# Patient Record
Sex: Male | Born: 1990 | Race: White | Hispanic: No | Marital: Married | Smoking: Never smoker
Health system: Southern US, Community
[De-identification: ages and names within clinical notes are randomized; demographics above are authoritative.]

## PROBLEM LIST (undated history)

## (undated) HISTORY — PX: HERNIA REPAIR: SHX51

---
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
## 2013-07-11 ENCOUNTER — Ambulatory Visit (INDEPENDENT_AMBULATORY_CARE_PROVIDER_SITE_OTHER): Payer: 59 | Admitting: Family Medicine

## 2013-07-11 ENCOUNTER — Encounter: Payer: Self-pay | Admitting: Family Medicine

## 2013-07-11 VITALS — BP 126/83 | HR 79 | Ht 67.0 in | Wt 135.0 lb

## 2013-07-11 DIAGNOSIS — R209 Unspecified disturbances of skin sensation: Secondary | ICD-10-CM

## 2013-07-11 DIAGNOSIS — R200 Anesthesia of skin: Secondary | ICD-10-CM

## 2013-07-11 NOTE — Patient Instructions (Signed)
You suffered a nerve contusion at your wrist. This should resolve over the next 4 weeks though could take up to a year at the worst. Don't put ice over this area. Tylenol, ibuprofen as needed if you have pain. If you get worsening weakness, call me (this is unlikely). Otherwise you can continue to play sports without restrictions.  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  It can also be autonomic dysfunction where you sweat inappropriately from the nerves not responding appropriately that feed sweat glands. You should discuss this more with a family physician to rule out underlying conditions that can cause this. There are some prescription strength medicines, antiperspirants that can be used if those are ruled out.

## 2013-07-12 ENCOUNTER — Encounter: Payer: Self-pay | Admitting: Family Medicine

## 2013-07-12 DIAGNOSIS — R200 Anesthesia of skin: Secondary | ICD-10-CM | POA: Insufficient documentation

## 2013-07-12 NOTE — Progress Notes (Signed)
Patient ID: Dustin Moreno, male   DOB: 1991-07-09, 22 y.o.   MRN: [id number]  PCP: No primary provider on file.  Subjective:   HPI: Patient is a 22 y.o. male here for left thumb numbness.  Patient reports when playing in a club lacrosse game on 10/9 he was hit on radial side of wrist at least once, probably twice. Had pain initially, swelling, mild bruising. Also developed thumb numbness throughout that has persisted while pain has resolved. No weakness. Is left handed.  History reviewed. No pertinent past medical history.  No current outpatient prescriptions on file prior to visit.   No current facility-administered medications on file prior to visit.    Past Surgical History  Procedure Laterality Date  . Hernia repair      No Known Allergies  History   Social History  . Marital Status: Married    Spouse Name: N/A    Number of Children: N/A  . Years of Education: N/A   Occupational History  . Not on file.   Social History Main Topics  . Smoking status: Never Smoker   . Smokeless tobacco: Not on file  . Alcohol Use: Not on file  . Drug Use: Not on file  . Sexual Activity: Not on file   Other Topics Concern  . Not on file   Social History Narrative  . No narrative on file    Family History  Problem Relation Age of Onset  . Sudden death Neg Hx   . Hypertension Neg Hx   . Hyperlipidemia Neg Hx   . Heart attack Neg Hx   . Diabetes Neg Hx     BP 126/83  Pulse 79  Ht 5\' 7"  (1.702 m)  Wt 135 lb (61.236 kg)  BMI 21.14 kg/m2  Review of Systems: See HPI above.    Objective:  Physical Exam:  Gen: NAD  Left hand: Mild bruising radial aspect of wrist.  No swelling, other deformity.  No palpable abnormality, aneurysm of radial artery. No focal TTP. FROM thumb with 5/5 strength all motions of this.  5/5 finger abduction, extension. NVI distally.  MSK u/s:  No evidence of radial artery aneurysm.    Assessment & Plan:  1. Left thumb numbness - 2/2  nerve contusion.  Strength is intact, no tenderness to suggest fracture or warrant radiographs.  Reassured.  Tylenol or ibuprofen if he needed this.  Do not ice the area as can make the numbness persist, worsen.  Cleared for all sports.  Advised this can possibly take months to resolve but likely will improve over next month.

## 2013-07-12 NOTE — Assessment & Plan Note (Signed)
2/2 nerve contusion.  Strength is intact, no tenderness to suggest fracture or warrant radiographs.  Reassured.  Tylenol or ibuprofen if he needed this.  Do not ice the area as can make the numbness persist, worsen.  Cleared for all sports.  Advised this can possibly take months to resolve but likely will improve over next month.

## 2015-07-03 ENCOUNTER — Encounter: Payer: Self-pay | Admitting: Family Medicine

## 2015-07-03 ENCOUNTER — Ambulatory Visit (INDEPENDENT_AMBULATORY_CARE_PROVIDER_SITE_OTHER): Payer: 59 | Admitting: Family Medicine

## 2015-07-03 VITALS — BP 110/75 | HR 65 | Ht 67.0 in | Wt 138.0 lb

## 2015-07-03 DIAGNOSIS — M25572 Pain in left ankle and joints of left foot: Secondary | ICD-10-CM

## 2015-07-03 NOTE — Patient Instructions (Signed)
You have an overuse strain of your posterior tibialis tendon. Arch support is key for this - try the new [employer] shoes you have for at least a few days - if you're not noticing improving buy dr scholls active series inserts or something similar. Icing 15 minutes at a time as needed. Ibuprofen or aleve as needed for pain and inflammation. Theraband strengthening 3 sets of 10 once a day (can do calf raise exercise as well 3 sets of 10) for next 4-6 weeks. Running when tolerated - should not be limping and pain should be a 1-2/10 at most when you're running. Can consider nitro patches, custom orthotics, physical therapy if not improving. Follow up with me in 6 weeks or as needed.

## 2015-07-07 DIAGNOSIS — M25572 Pain in left ankle and joints of left foot: Secondary | ICD-10-CM | POA: Insufficient documentation

## 2015-07-07 NOTE — Progress Notes (Signed)
PCP: No primary care provider on file.  Subjective:   HPI: Patient is a 24 y.o. male here for left ankle tightness.  Patient reports over past couple weeks he's had increasing tightness medial left ankle. No known injury or trauma. Just woke up one morning with this. Pain level 5/10, dull. Tried icing, stretching. Runs 30 miles a week but hasn't in about 2 weeks. No skin changes, swelling, fever, other complaints.  No past medical history on file.  Current Outpatient Prescriptions on File Prior to Visit  Medication Sig Dispense Refill  . loteprednol (LOTEMAX) 0.2 % SUSP 1 drop 4 (four) times daily.     No current facility-administered medications on file prior to visit.    Past Surgical History  Procedure Laterality Date  . Hernia repair      No Known Allergies  Social History   Social History  . Marital Status: Married    Spouse Name: N/A  . Number of Children: N/A  . Years of Education: N/A   Occupational History  . Not on file.   Social History Main Topics  . Smoking status: Never Smoker   . Smokeless tobacco: Not on file  . Alcohol Use: Not on file  . Drug Use: Not on file  . Sexual Activity: Not on file   Other Topics Concern  . Not on file   Social History Narrative    Family History  Problem Relation Age of Onset  . Sudden death Neg Hx   . Hypertension Neg Hx   . Hyperlipidemia Neg Hx   . Heart attack Neg Hx   . Diabetes Neg Hx     BP 110/75 mmHg  Pulse 65  Ht  (1.702 m)  Wt 138 lb (62.596 kg)  BMI 21.61 kg/m2  Review of Systems: See HPI above.    Objective:  Physical Exam:  Gen: NAD  Left ankle: No gross deformity, swelling, ecchymoses Mild overpronation. FROM with pain on IR. TTP over post tib tendon.  No bony or other tenderness. Negative ant drawer and talar tilt.   Negative syndesmotic compression. Thompsons test negative. NV intact distally.  Right ankle: FROM without pain.    Assessment & Plan:  1. Left  ankle pain - 2/2 overuse strain of post tib tendon.  Stressed importance of arch support, avoid barefoot walking/flat shoes as much as possible.  Icing, nsaids as needed.  Shown home exercises to do daily as well.  Activities as tolerated otherwise.  F/u in 6 weeks.  Consider nitro patches, orthotics, PT if not improving.

## 2015-07-07 NOTE — Assessment & Plan Note (Signed)
2/2 overuse strain of post tib tendon.  Stressed importance of arch support, avoid barefoot walking/flat shoes as much as possible.  Icing, nsaids as needed.  Shown home exercises to do daily as well.  Activities as tolerated otherwise.  F/u in 6 weeks.  Consider nitro patches, orthotics, PT if not improving.

## 2015-11-04 MED FILL — DOXYCYCLINE HYCLATE 100 MG: 100 | 30 days supply | Qty: 30 | Fill #0

## 2016-04-25 DIAGNOSIS — H0012 Chalazion right lower eyelid: Secondary | ICD-10-CM | POA: Diagnosis not present

## 2016-04-25 DIAGNOSIS — H11431 Conjunctival hyperemia, right eye: Secondary | ICD-10-CM | POA: Diagnosis not present

## 2016-04-25 DIAGNOSIS — H018 Other specified inflammations of eyelid: Secondary | ICD-10-CM | POA: Diagnosis not present

## 2016-04-25 MED FILL — DOXYCYCLINE HYCLATE 100 MG: 100 | 30 days supply | Qty: 60 | Fill #0

## 2016-05-04 DIAGNOSIS — H01022 Squamous blepharitis right lower eyelid: Secondary | ICD-10-CM | POA: Diagnosis not present

## 2016-05-04 DIAGNOSIS — H0012 Chalazion right lower eyelid: Secondary | ICD-10-CM | POA: Diagnosis not present

## 2016-05-04 DIAGNOSIS — H01025 Squamous blepharitis left lower eyelid: Secondary | ICD-10-CM | POA: Diagnosis not present

## 2016-05-04 DIAGNOSIS — H01024 Squamous blepharitis left upper eyelid: Secondary | ICD-10-CM | POA: Diagnosis not present

## 2016-05-04 DIAGNOSIS — H01021 Squamous blepharitis right upper eyelid: Secondary | ICD-10-CM | POA: Diagnosis not present

## 2016-09-12 DIAGNOSIS — H01025 Squamous blepharitis left lower eyelid: Secondary | ICD-10-CM | POA: Diagnosis not present

## 2016-09-12 DIAGNOSIS — H01024 Squamous blepharitis left upper eyelid: Secondary | ICD-10-CM | POA: Diagnosis not present

## 2016-09-12 DIAGNOSIS — H01022 Squamous blepharitis right lower eyelid: Secondary | ICD-10-CM | POA: Diagnosis not present

## 2016-09-12 DIAGNOSIS — H01021 Squamous blepharitis right upper eyelid: Secondary | ICD-10-CM | POA: Diagnosis not present

## 2017-09-11 DIAGNOSIS — H01024 Squamous blepharitis left upper eyelid: Secondary | ICD-10-CM | POA: Diagnosis not present

## 2017-09-11 DIAGNOSIS — H10413 Chronic giant papillary conjunctivitis, bilateral: Secondary | ICD-10-CM | POA: Diagnosis not present

## 2017-09-11 DIAGNOSIS — H01021 Squamous blepharitis right upper eyelid: Secondary | ICD-10-CM | POA: Diagnosis not present

## 2017-09-11 DIAGNOSIS — H01022 Squamous blepharitis right lower eyelid: Secondary | ICD-10-CM | POA: Diagnosis not present

## 2017-09-11 DIAGNOSIS — H01025 Squamous blepharitis left lower eyelid: Secondary | ICD-10-CM | POA: Diagnosis not present

## 2018-09-17 DIAGNOSIS — H01025 Squamous blepharitis left lower eyelid: Secondary | ICD-10-CM | POA: Diagnosis not present

## 2018-09-17 DIAGNOSIS — H01024 Squamous blepharitis left upper eyelid: Secondary | ICD-10-CM | POA: Diagnosis not present

## 2018-09-17 DIAGNOSIS — H01022 Squamous blepharitis right lower eyelid: Secondary | ICD-10-CM | POA: Diagnosis not present

## 2018-09-17 DIAGNOSIS — H01021 Squamous blepharitis right upper eyelid: Secondary | ICD-10-CM | POA: Diagnosis not present

## 2018-09-17 DIAGNOSIS — H10413 Chronic giant papillary conjunctivitis, bilateral: Secondary | ICD-10-CM | POA: Diagnosis not present
